# Patient Record
Sex: Female | Born: 1965 | Race: Black or African American | Hispanic: No | Marital: Married | Smoking: Current every day smoker
Health system: Southern US, Community
[De-identification: ages and names within clinical notes are randomized; demographics above are authoritative.]

## PROBLEM LIST (undated history)

## (undated) DIAGNOSIS — I10 Essential (primary) hypertension: Secondary | ICD-10-CM

## (undated) DIAGNOSIS — E119 Type 2 diabetes mellitus without complications: Secondary | ICD-10-CM

## (undated) HISTORY — PX: BREAST BIOPSY: SHX20

## (undated) HISTORY — DX: Essential (primary) hypertension: I10

## (undated) HISTORY — DX: Type 2 diabetes mellitus without complications: E11.9

---

## 2000-03-03 ENCOUNTER — Ambulatory Visit (HOSPITAL_BASED_OUTPATIENT_CLINIC_OR_DEPARTMENT_OTHER): Admission: RE | Admit: 2000-03-03 | Discharge: 2000-03-03 | Payer: Self-pay | Admitting: Ophthalmology

## 2000-04-15 ENCOUNTER — Ambulatory Visit (HOSPITAL_BASED_OUTPATIENT_CLINIC_OR_DEPARTMENT_OTHER): Admission: RE | Admit: 2000-04-15 | Discharge: 2000-04-15 | Payer: Self-pay | Admitting: Ophthalmology

## 2005-07-07 ENCOUNTER — Encounter: Admission: RE | Admit: 2005-07-07 | Discharge: 2005-07-07 | Payer: Self-pay | Admitting: Obstetrics and Gynecology

## 2006-08-17 ENCOUNTER — Encounter: Admission: RE | Admit: 2006-08-17 | Discharge: 2006-08-17 | Payer: Self-pay | Admitting: Obstetrics and Gynecology

## 2006-08-25 ENCOUNTER — Encounter: Admission: RE | Admit: 2006-08-25 | Discharge: 2006-08-25 | Payer: Self-pay | Admitting: Obstetrics and Gynecology

## 2007-02-27 ENCOUNTER — Encounter: Admission: RE | Admit: 2007-02-27 | Discharge: 2007-02-27 | Payer: Self-pay | Admitting: Obstetrics and Gynecology

## 2007-08-24 ENCOUNTER — Encounter: Admission: RE | Admit: 2007-08-24 | Discharge: 2007-08-24 | Payer: Self-pay | Admitting: Obstetrics and Gynecology

## 2007-12-20 IMAGING — [facility] MM DIAGNOSTIC LTD RIGHT
4 series · 4 of 4 positions shown · non-contrast
Comparison: none

[REDACTED] RIGHT
CC and MLO view(s) were taken of the right breast.

RIGHT BREAST ULTRASOUND
Technologist: Yulmar Malespin, Medical
DIGITAL LIMITED RIGHT DIAGNOSTIC MAMMOGRAM AND RIGHT BREAST ULTRASOUND:
CLINICAL DATA: 40-year-old returns after screening study for evaluation of the right breast.

[R CC (1 of 2)]
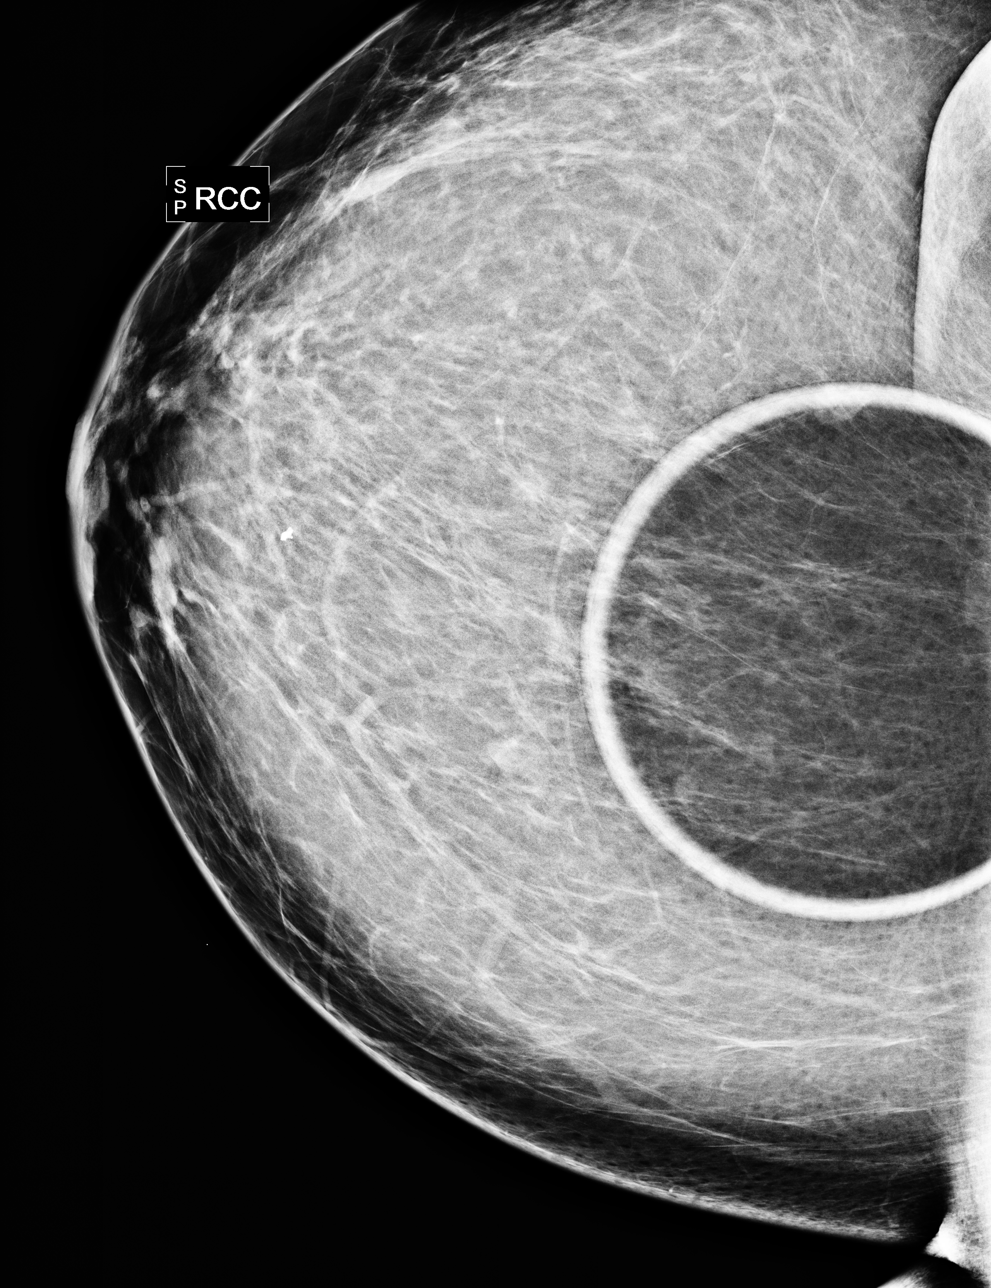

[R MLO (1 of 2)]
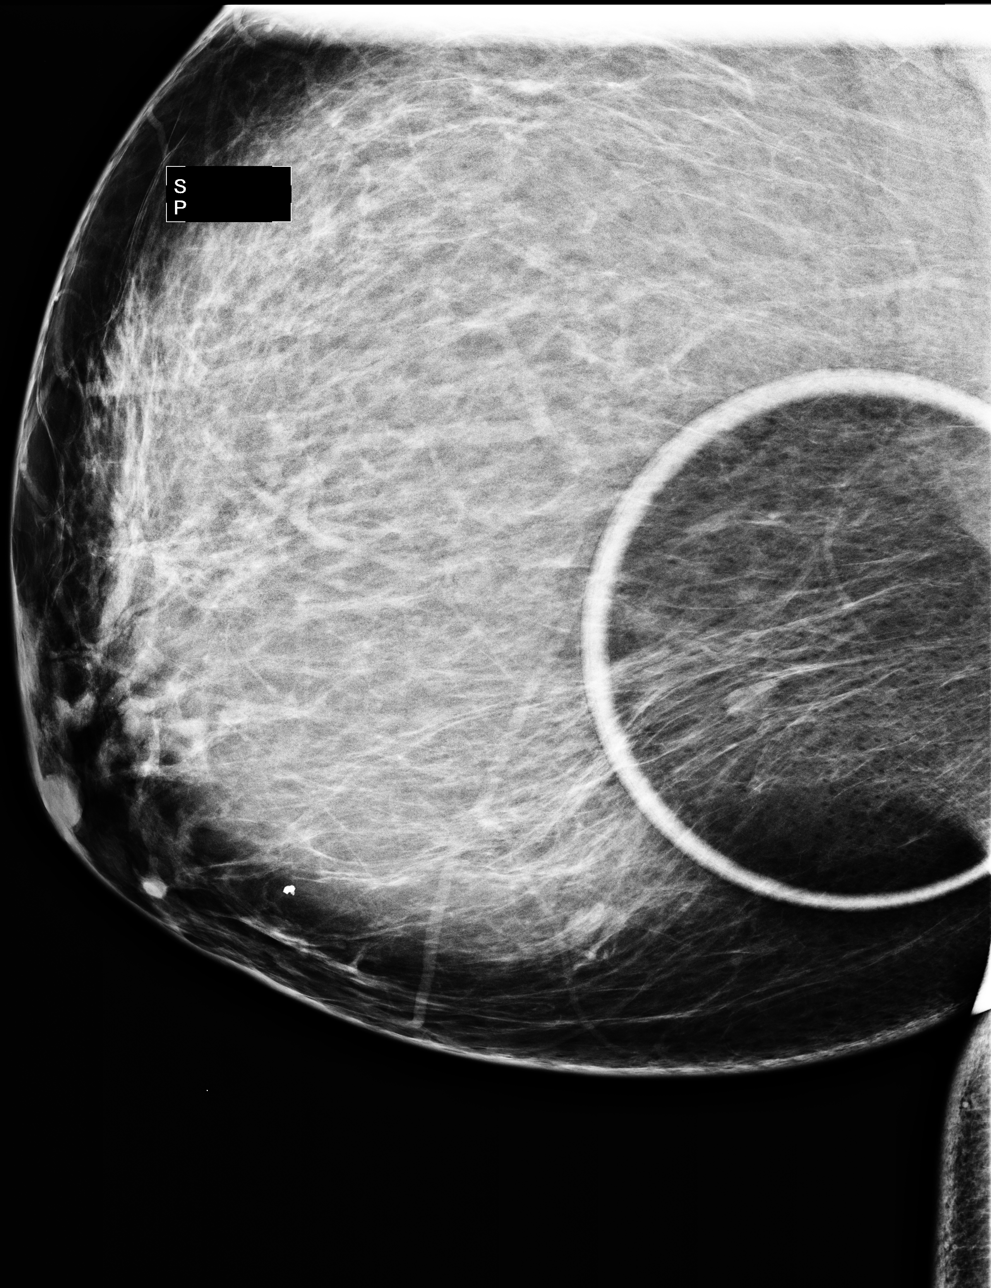

[R CC (2 of 2)]
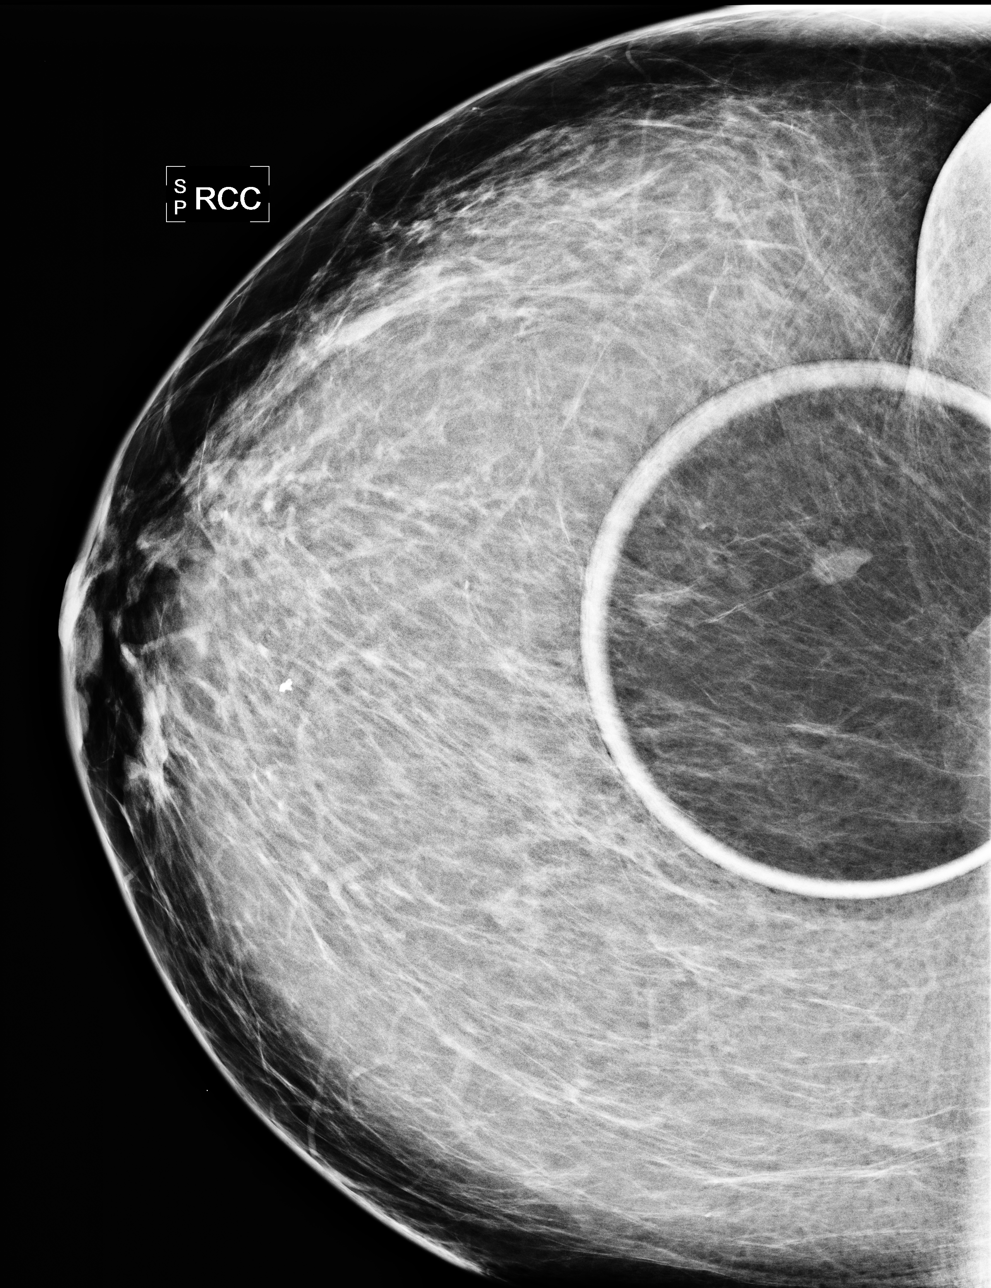

[R MLO (2 of 2)]
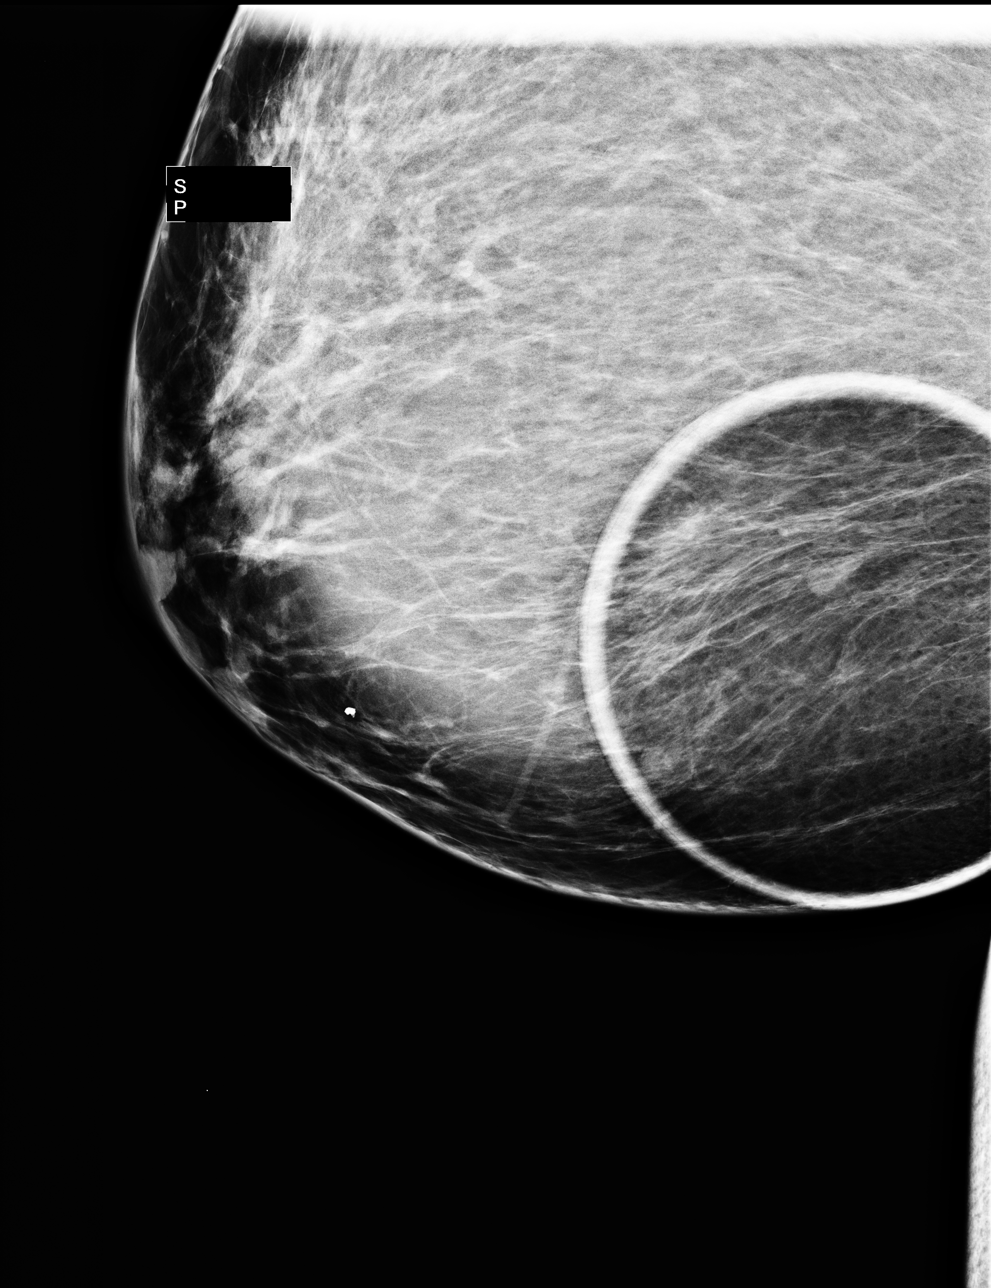

[4 of 4 positions shown; findings below may reference images not displayed]

Spot compression views are performed of the lower outer quadrant of the right breast, confirming 
the presence of a well-circumscribed subcentimeter nodule, deep within the breast.  On spot 
compression views the appearance favors a benign intramammary node based on the presence of fatty 
notch.

On physical exam, I do not palpate an abnormality in the lower outer quadrant of the right breast. 
Ultrasound is performed showing normal-appearing fibroglandular parenchyma.  No mass or acoustic 
shadowing is identified in the lower outer quadrant.
IMPRESSION: Persistent nodule in the right breast is likely a benign intramammary lymph node.  I would suggest 
a follow-up right mammogram in six months as this is not seen sonographically.

ASSESSMENT: Probably benign - BI-RADS 3

Diagnostic mammogram of the right breast in 6 months.
,

## 2008-08-27 ENCOUNTER — Encounter: Admission: RE | Admit: 2008-08-27 | Discharge: 2008-08-27 | Payer: Self-pay | Admitting: Obstetrics and Gynecology

## 2009-09-22 ENCOUNTER — Encounter: Admission: RE | Admit: 2009-09-22 | Discharge: 2009-09-22 | Payer: Self-pay | Admitting: Obstetrics and Gynecology

## 2009-12-12 ENCOUNTER — Emergency Department (HOSPITAL_COMMUNITY): Admission: EM | Admit: 2009-12-12 | Discharge: 2009-12-12 | Payer: Self-pay | Admitting: Family Medicine

## 2010-08-28 ENCOUNTER — Other Ambulatory Visit: Payer: Self-pay | Admitting: Obstetrics and Gynecology

## 2010-08-28 DIAGNOSIS — Z1231 Encounter for screening mammogram for malignant neoplasm of breast: Secondary | ICD-10-CM

## 2010-09-25 ENCOUNTER — Ambulatory Visit
Admission: RE | Admit: 2010-09-25 | Discharge: 2010-09-25 | Disposition: A | Discharge status: Home / Self Care | Payer: 59 | Source: Ambulatory Visit | Attending: Obstetrics and Gynecology | Admitting: Obstetrics and Gynecology

## 2010-09-25 DIAGNOSIS — Z1231 Encounter for screening mammogram for malignant neoplasm of breast: Secondary | ICD-10-CM

## 2011-03-22 ENCOUNTER — Other Ambulatory Visit: Payer: Self-pay | Admitting: Obstetrics & Gynecology

## 2011-03-31 ENCOUNTER — Other Ambulatory Visit: Payer: Self-pay | Admitting: Obstetrics & Gynecology

## 2011-03-31 ENCOUNTER — Other Ambulatory Visit: Payer: Self-pay | Admitting: Diagnostic Radiology

## 2011-03-31 ENCOUNTER — Ambulatory Visit
Admission: RE | Admit: 2011-03-31 | Discharge: 2011-03-31 | Disposition: A | Discharge status: Home / Self Care | Payer: 59 | Source: Ambulatory Visit | Attending: Obstetrics & Gynecology | Admitting: Obstetrics & Gynecology

## 2011-03-31 DIAGNOSIS — N6315 Unspecified lump in the right breast, overlapping quadrants: Secondary | ICD-10-CM

## 2011-04-02 ENCOUNTER — Ambulatory Visit
Admission: RE | Admit: 2011-04-02 | Discharge: 2011-04-02 | Disposition: A | Discharge status: Home / Self Care | Payer: 59 | Source: Ambulatory Visit | Attending: Obstetrics & Gynecology | Admitting: Obstetrics & Gynecology

## 2011-04-28 ENCOUNTER — Encounter: Payer: 59 | Admitting: Family Medicine

## 2011-09-21 ENCOUNTER — Other Ambulatory Visit: Payer: Self-pay | Admitting: Obstetrics & Gynecology

## 2011-09-21 DIAGNOSIS — Z1231 Encounter for screening mammogram for malignant neoplasm of breast: Secondary | ICD-10-CM

## 2011-10-05 ENCOUNTER — Ambulatory Visit
Admission: RE | Admit: 2011-10-05 | Discharge: 2011-10-05 | Disposition: A | Discharge status: Home / Self Care | Payer: 59 | Source: Ambulatory Visit | Attending: Obstetrics & Gynecology | Admitting: Obstetrics & Gynecology

## 2011-10-05 DIAGNOSIS — Z1231 Encounter for screening mammogram for malignant neoplasm of breast: Secondary | ICD-10-CM

## 2013-03-05 ENCOUNTER — Other Ambulatory Visit: Payer: Self-pay

## 2013-03-05 DIAGNOSIS — Z1231 Encounter for screening mammogram for malignant neoplasm of breast: Secondary | ICD-10-CM

## 2013-03-28 ENCOUNTER — Ambulatory Visit
Admission: RE | Admit: 2013-03-28 | Discharge: 2013-03-28 | Disposition: A | Discharge status: Home / Self Care | Payer: 59 | Source: Ambulatory Visit

## 2013-03-28 DIAGNOSIS — Z1231 Encounter for screening mammogram for malignant neoplasm of breast: Secondary | ICD-10-CM

## 2014-03-19 ENCOUNTER — Other Ambulatory Visit: Payer: Self-pay

## 2014-03-19 DIAGNOSIS — Z1231 Encounter for screening mammogram for malignant neoplasm of breast: Secondary | ICD-10-CM

## 2014-03-29 ENCOUNTER — Ambulatory Visit
Admission: RE | Admit: 2014-03-29 | Discharge: 2014-03-29 | Disposition: A | Discharge status: Home / Self Care | Payer: 59 | Source: Ambulatory Visit

## 2014-03-29 DIAGNOSIS — Z1231 Encounter for screening mammogram for malignant neoplasm of breast: Secondary | ICD-10-CM

## 2015-01-01 ENCOUNTER — Other Ambulatory Visit: Payer: Self-pay | Admitting: Internal Medicine

## 2015-01-01 ENCOUNTER — Ambulatory Visit
Admission: RE | Admit: 2015-01-01 | Discharge: 2015-01-01 | Disposition: A | Discharge status: Home / Self Care | Payer: 59 | Source: Ambulatory Visit | Attending: Internal Medicine | Admitting: Internal Medicine

## 2015-01-01 DIAGNOSIS — M79671 Pain in right foot: Secondary | ICD-10-CM

## 2015-03-20 ENCOUNTER — Other Ambulatory Visit: Payer: Self-pay

## 2015-03-20 DIAGNOSIS — Z1231 Encounter for screening mammogram for malignant neoplasm of breast: Secondary | ICD-10-CM

## 2015-04-11 ENCOUNTER — Ambulatory Visit
Admission: RE | Admit: 2015-04-11 | Discharge: 2015-04-11 | Disposition: A | Discharge status: Home / Self Care | Payer: 59 | Source: Ambulatory Visit

## 2015-04-11 DIAGNOSIS — Z1231 Encounter for screening mammogram for malignant neoplasm of breast: Secondary | ICD-10-CM

## 2015-11-14 ENCOUNTER — Other Ambulatory Visit: Payer: Self-pay | Admitting: Gastroenterology

## 2016-03-26 ENCOUNTER — Other Ambulatory Visit: Payer: Self-pay | Admitting: Obstetrics & Gynecology

## 2016-03-26 DIAGNOSIS — Z1231 Encounter for screening mammogram for malignant neoplasm of breast: Secondary | ICD-10-CM

## 2016-04-14 ENCOUNTER — Ambulatory Visit
Admission: RE | Admit: 2016-04-14 | Discharge: 2016-04-14 | Disposition: A | Discharge status: Home / Self Care | Payer: 59 | Source: Ambulatory Visit | Attending: Obstetrics & Gynecology | Admitting: Obstetrics & Gynecology

## 2016-04-14 DIAGNOSIS — Z1231 Encounter for screening mammogram for malignant neoplasm of breast: Secondary | ICD-10-CM

## 2016-07-09 ENCOUNTER — Emergency Department (HOSPITAL_COMMUNITY)
Admission: EM | Admit: 2016-07-09 | Discharge: 2016-07-09 | Disposition: A | Discharge status: Home / Self Care | Payer: 59 | Attending: Emergency Medicine | Admitting: Emergency Medicine

## 2016-07-09 ENCOUNTER — Encounter (HOSPITAL_COMMUNITY): Payer: Self-pay | Admitting: Emergency Medicine

## 2016-07-09 DIAGNOSIS — N3001 Acute cystitis with hematuria: Secondary | ICD-10-CM | POA: Diagnosis not present

## 2016-07-09 DIAGNOSIS — R300 Dysuria: Secondary | ICD-10-CM | POA: Diagnosis present

## 2016-07-09 LAB — URINALYSIS, ROUTINE W REFLEX MICROSCOPIC
BACTERIA UA: NONE SEEN
BILIRUBIN URINE: NEGATIVE
Glucose, UA: NEGATIVE mg/dL
KETONES UR: NEGATIVE mg/dL
Nitrite: NEGATIVE
PH: 6 (ref 5.0–8.0)
Protein, ur: 100 mg/dL — AB
SQUAMOUS EPITHELIAL / LPF: NONE SEEN
Specific Gravity, Urine: 1.012 (ref 1.005–1.030)

## 2016-07-09 LAB — POC URINE PREG, ED: Preg Test, Ur: NEGATIVE

## 2016-07-09 MED ORDER — IBUPROFEN 400 MG PO TABS
600.0000 mg | ORAL_TABLET | Freq: Once | ORAL | Status: AC
  Administered 2016-07-09: 600 mg via ORAL
  Filled 2016-07-09: qty 1

## 2016-07-09 MED ORDER — CEPHALEXIN 500 MG PO CAPS: 500.0000 mg | ORAL_CAPSULE | Freq: Two times a day (BID) | ORAL | 0 refills | Status: AC

## 2016-07-09 MED ORDER — CEPHALEXIN 250 MG PO CAPS
500.0000 mg | ORAL_CAPSULE | Freq: Once | ORAL | Status: AC
  Administered 2016-07-09: 500 mg via ORAL
  Filled 2016-07-09: qty 2

## 2016-07-09 NOTE — ED Triage Notes (Signed)
Pt in reporting painful urination and blood in urine. Denies hx kidney stones

## 2016-07-09 NOTE — ED Provider Notes (Signed)
[facility]EMERGENCY DEPT Provider Note   CSN: [id number] Arrival date & time: 07/09/16  7829     History   Chief Complaint Chief Complaint  Patient presents with  . Dysuria    HPI Cynthia Noble is a 51 y.o. female no sig PMH here with dysuria and hematuria starting last night.  She denies any history of prior kidney stones.  She states she had a UTI 20 years ago but does not know if it feels the same. She denies fever, N/V/D.  She has not been ill recently.  She denies and abdominal or back pain.  There are no further complaints.   10 Systems reviewed and are negative for acute change except as noted in the HPI.   HPI  History reviewed. No pertinent past medical history.  There are no active problems to display for this patient.   History reviewed. No pertinent surgical history.  OB History    No data available       Home Medications    Prior to Admission medications   Not on File    Family History No family history on file.  Social History Social History  Substance Use Topics  . Smoking status: Not on file  . Smokeless tobacco: Not on file  . Alcohol use Not on file     Allergies   Patient has no known allergies.   Review of Systems Review of Systems   Physical Exam Updated Vital Signs BP 135/73 (BP Location: Left Arm)   Pulse 93   Temp 98.2 F (36.8 C) (Oral)   Resp 18   Ht 5\' 3"  (1.6 m)   Wt 210 lb (95.3 kg)   SpO2 98%   BMI 37.20 kg/m   Physical Exam  Constitutional: She is oriented to person, place, and time. She appears well-developed and well-nourished. No distress.  HENT:  Head: Normocephalic and atraumatic.  Nose: Nose normal.  Mouth/Throat: Oropharynx is clear and moist. No oropharyngeal exudate.  Eyes: Conjunctivae and EOM are normal. Pupils are equal, round, and reactive to light. No scleral icterus.  Neck: Normal range of motion. Neck supple. No JVD present. No tracheal deviation present. No thyromegaly present.    Cardiovascular: Normal rate, regular rhythm and normal heart sounds.  Exam reveals no gallop and no friction rub.   No murmur heard. Pulmonary/Chest: Effort normal and breath sounds normal. No respiratory distress. She has no wheezes. She exhibits no tenderness.  Abdominal: Soft. Bowel sounds are normal. She exhibits no distension and no mass. There is no tenderness. There is no rebound and no guarding.  Musculoskeletal: Normal range of motion. She exhibits no edema or tenderness.  Lymphadenopathy:    She has no cervical adenopathy.  Neurological: She is alert and oriented to person, place, and time. No cranial nerve deficit. She exhibits normal muscle tone.  Skin: Skin is warm and dry. No rash noted. No erythema. No pallor.  Nursing note and vitals reviewed.    ED Treatments / Results  Labs (all labs ordered are listed, but only abnormal results are displayed) Labs Reviewed  URINE CULTURE  URINALYSIS, ROUTINE W REFLEX MICROSCOPIC  POC URINE PREG, ED    EKG  EKG Interpretation None       Radiology No results found.  Procedures Procedures (including critical care time)  Medications Ordered in ED Medications  ibuprofen (ADVIL,MOTRIN) tablet 600 mg (not administered)     Initial Impression / Assessment and Plan / ED Course  I have reviewed the  triage vital signs and the nursing notes.  Pertinent labs & imaging results that were available during my care of the patient were reviewed by me and considered in my medical decision making (see chart for details).      Patient presents to the ED for dysuria and hematuria.  UA reveals an infection.  Will treat with keflex, first dose given in the ED.  Plan to fu with PCP within 3 days for close follow up. She demonstrates good understanding of the plan. She appears well and in NAD.  Vs remain within her normal limits and she is safe for Dc.   Final Clinical Impressions(s) / ED Diagnoses   Final diagnoses:  None    New  Prescriptions New Prescriptions   No medications on file     Tomasita CrumbleAdeleke Andres Bantz, MD 07/09/16 [phone]

## 2016-07-11 LAB — URINE CULTURE

## 2016-07-12 ENCOUNTER — Telehealth (HOSPITAL_BASED_OUTPATIENT_CLINIC_OR_DEPARTMENT_OTHER): Payer: Self-pay | Admitting: Emergency Medicine

## 2016-07-12 NOTE — Telephone Encounter (Signed)
Post ED Visit - Positive Culture Follow-up  Culture report reviewed by antimicrobial stewardship pharmacist:  []  Enzo BiNathan Batchelder, Pharm.D. []  Celedonio MiyamotoJeremy Frens, Pharm.D., BCPS []  Garvin FilaMike Maccia, Pharm.D. []  Georgina PillionElizabeth Martin, Pharm.D., BCPS []  EaglevilleMinh Pham, VermontPharm.D., BCPS, AAHIVP []  Estella HuskMichelle Turner, Pharm.D., BCPS, AAHIVP []  Tennis Mustassie Stewart, Pharm.D. []  Rob Oswaldo DoneVincent, [address] Rainbow BoulevardPharm.D. Casilda Carlsaylor Stone PharmD  Positive urine culture Treated with cephalexin, organism sensitive to the same and no further patient follow-up is required at this time.  Berle MullMiller, Hebe Merriwether 07/12/2016, 11:06 AM

## 2016-10-04 ENCOUNTER — Other Ambulatory Visit (HOSPITAL_COMMUNITY): Payer: Self-pay | Admitting: Internal Medicine

## 2016-10-04 DIAGNOSIS — E0590 Thyrotoxicosis, unspecified without thyrotoxic crisis or storm: Secondary | ICD-10-CM

## 2016-10-14 ENCOUNTER — Encounter (HOSPITAL_COMMUNITY)
Admission: RE | Admit: 2016-10-14 | Discharge: 2016-10-14 | Disposition: A | Discharge status: Home / Self Care | Payer: 59 | Source: Ambulatory Visit | Attending: Internal Medicine | Admitting: Internal Medicine

## 2016-10-14 DIAGNOSIS — E0590 Thyrotoxicosis, unspecified without thyrotoxic crisis or storm: Secondary | ICD-10-CM | POA: Insufficient documentation

## 2016-10-14 MED ORDER — SODIUM IODIDE I 131 CAPSULE
11.8000 | Freq: Once | INTRAVENOUS | Status: AC | PRN
Start: 2016-10-14 — End: 2016-10-14
  Administered 2016-10-14: 11.8 via ORAL

## 2016-10-15 ENCOUNTER — Encounter (HOSPITAL_COMMUNITY)
Admission: RE | Admit: 2016-10-15 | Discharge: 2016-10-15 | Disposition: A | Discharge status: Home / Self Care | Payer: 59 | Source: Ambulatory Visit | Attending: Internal Medicine | Admitting: Internal Medicine

## 2016-10-15 DIAGNOSIS — E0590 Thyrotoxicosis, unspecified without thyrotoxic crisis or storm: Secondary | ICD-10-CM | POA: Diagnosis present

## 2016-10-20 ENCOUNTER — Other Ambulatory Visit: Payer: Self-pay | Admitting: Internal Medicine

## 2016-10-20 DIAGNOSIS — E0590 Thyrotoxicosis, unspecified without thyrotoxic crisis or storm: Secondary | ICD-10-CM

## 2016-10-20 DIAGNOSIS — E042 Nontoxic multinodular goiter: Secondary | ICD-10-CM

## 2016-10-20 DIAGNOSIS — E041 Nontoxic single thyroid nodule: Secondary | ICD-10-CM

## 2016-10-29 ENCOUNTER — Ambulatory Visit
Admission: RE | Admit: 2016-10-29 | Discharge: 2016-10-29 | Disposition: A | Discharge status: Home / Self Care | Payer: 59 | Source: Ambulatory Visit | Attending: Internal Medicine | Admitting: Internal Medicine

## 2016-10-29 DIAGNOSIS — E041 Nontoxic single thyroid nodule: Secondary | ICD-10-CM

## 2016-10-29 DIAGNOSIS — E042 Nontoxic multinodular goiter: Secondary | ICD-10-CM

## 2016-10-29 DIAGNOSIS — E0590 Thyrotoxicosis, unspecified without thyrotoxic crisis or storm: Secondary | ICD-10-CM

## 2016-11-02 ENCOUNTER — Other Ambulatory Visit: Payer: Self-pay | Admitting: Internal Medicine

## 2016-11-02 DIAGNOSIS — E041 Nontoxic single thyroid nodule: Secondary | ICD-10-CM

## 2016-11-23 ENCOUNTER — Ambulatory Visit
Admission: RE | Admit: 2016-11-23 | Discharge: 2016-11-23 | Disposition: A | Discharge status: Home / Self Care | Payer: 59 | Source: Ambulatory Visit | Attending: Internal Medicine | Admitting: Internal Medicine

## 2016-11-23 ENCOUNTER — Other Ambulatory Visit (HOSPITAL_COMMUNITY)
Admission: RE | Admit: 2016-11-23 | Discharge: 2016-11-23 | Disposition: A | Discharge status: Home / Self Care | Payer: 59 | Source: Ambulatory Visit | Attending: Student | Admitting: Student

## 2016-11-23 DIAGNOSIS — E041 Nontoxic single thyroid nodule: Secondary | ICD-10-CM | POA: Diagnosis not present

## 2017-03-29 ENCOUNTER — Other Ambulatory Visit: Payer: Self-pay | Admitting: Obstetrics & Gynecology

## 2017-03-29 DIAGNOSIS — Z1231 Encounter for screening mammogram for malignant neoplasm of breast: Secondary | ICD-10-CM

## 2017-04-18 ENCOUNTER — Ambulatory Visit
Admission: RE | Admit: 2017-04-18 | Discharge: 2017-04-18 | Disposition: A | Discharge status: Home / Self Care | Payer: 59 | Source: Ambulatory Visit | Attending: Obstetrics & Gynecology | Admitting: Obstetrics & Gynecology

## 2017-04-18 DIAGNOSIS — Z1231 Encounter for screening mammogram for malignant neoplasm of breast: Secondary | ICD-10-CM

## 2018-03-23 ENCOUNTER — Other Ambulatory Visit: Payer: Self-pay | Admitting: Obstetrics & Gynecology

## 2018-03-23 DIAGNOSIS — Z1231 Encounter for screening mammogram for malignant neoplasm of breast: Secondary | ICD-10-CM

## 2018-04-19 ENCOUNTER — Ambulatory Visit
Admission: RE | Admit: 2018-04-19 | Discharge: 2018-04-19 | Disposition: A | Discharge status: Home / Self Care | Payer: 59 | Source: Ambulatory Visit | Attending: Obstetrics & Gynecology | Admitting: Obstetrics & Gynecology

## 2018-04-19 ENCOUNTER — Ambulatory Visit: Payer: 59

## 2018-04-19 DIAGNOSIS — Z1231 Encounter for screening mammogram for malignant neoplasm of breast: Secondary | ICD-10-CM

## 2018-10-23 DIAGNOSIS — E1122 Type 2 diabetes mellitus with diabetic chronic kidney disease: Secondary | ICD-10-CM | POA: Diagnosis not present

## 2018-10-23 DIAGNOSIS — Z5181 Encounter for therapeutic drug level monitoring: Secondary | ICD-10-CM | POA: Diagnosis not present

## 2018-10-23 DIAGNOSIS — N181 Chronic kidney disease, stage 1: Secondary | ICD-10-CM | POA: Diagnosis not present

## 2018-10-23 DIAGNOSIS — Z720 Tobacco use: Secondary | ICD-10-CM | POA: Diagnosis not present

## 2019-04-11 DIAGNOSIS — Z01419 Encounter for gynecological examination (general) (routine) without abnormal findings: Secondary | ICD-10-CM | POA: Diagnosis not present

## 2019-04-11 DIAGNOSIS — Z6836 Body mass index (BMI) 36.0-36.9, adult: Secondary | ICD-10-CM | POA: Diagnosis not present

## 2019-04-11 DIAGNOSIS — Z1151 Encounter for screening for human papillomavirus (HPV): Secondary | ICD-10-CM | POA: Diagnosis not present

## 2019-04-11 DIAGNOSIS — Z1231 Encounter for screening mammogram for malignant neoplasm of breast: Secondary | ICD-10-CM | POA: Diagnosis not present

## 2019-08-31 ENCOUNTER — Other Ambulatory Visit: Payer: Self-pay | Admitting: Internal Medicine

## 2019-08-31 DIAGNOSIS — Z13220 Encounter for screening for lipoid disorders: Secondary | ICD-10-CM | POA: Diagnosis not present

## 2019-08-31 DIAGNOSIS — E042 Nontoxic multinodular goiter: Secondary | ICD-10-CM

## 2019-08-31 DIAGNOSIS — Z5181 Encounter for therapeutic drug level monitoring: Secondary | ICD-10-CM | POA: Diagnosis not present

## 2019-08-31 DIAGNOSIS — N181 Chronic kidney disease, stage 1: Secondary | ICD-10-CM | POA: Diagnosis not present

## 2019-08-31 DIAGNOSIS — E1122 Type 2 diabetes mellitus with diabetic chronic kidney disease: Secondary | ICD-10-CM | POA: Diagnosis not present

## 2019-08-31 DIAGNOSIS — Z720 Tobacco use: Secondary | ICD-10-CM | POA: Diagnosis not present

## 2019-09-04 ENCOUNTER — Ambulatory Visit
Admission: RE | Admit: 2019-09-04 | Discharge: 2019-09-04 | Disposition: A | Discharge status: Home / Self Care | Payer: BC Managed Care – PPO | Source: Ambulatory Visit | Attending: Internal Medicine | Admitting: Internal Medicine

## 2019-09-04 DIAGNOSIS — E042 Nontoxic multinodular goiter: Secondary | ICD-10-CM

## 2019-09-04 DIAGNOSIS — E041 Nontoxic single thyroid nodule: Secondary | ICD-10-CM | POA: Diagnosis not present

## 2020-03-03 DIAGNOSIS — Z720 Tobacco use: Secondary | ICD-10-CM | POA: Diagnosis not present

## 2020-03-03 DIAGNOSIS — N181 Chronic kidney disease, stage 1: Secondary | ICD-10-CM | POA: Diagnosis not present

## 2020-03-03 DIAGNOSIS — E1122 Type 2 diabetes mellitus with diabetic chronic kidney disease: Secondary | ICD-10-CM | POA: Diagnosis not present

## 2020-03-03 DIAGNOSIS — E669 Obesity, unspecified: Secondary | ICD-10-CM | POA: Diagnosis not present

## 2020-04-15 DIAGNOSIS — Z1231 Encounter for screening mammogram for malignant neoplasm of breast: Secondary | ICD-10-CM | POA: Diagnosis not present

## 2020-04-15 DIAGNOSIS — Z01419 Encounter for gynecological examination (general) (routine) without abnormal findings: Secondary | ICD-10-CM | POA: Diagnosis not present

## 2020-04-15 DIAGNOSIS — Z6836 Body mass index (BMI) 36.0-36.9, adult: Secondary | ICD-10-CM | POA: Diagnosis not present

## 2020-07-17 DIAGNOSIS — Z23 Encounter for immunization: Secondary | ICD-10-CM | POA: Diagnosis not present

## 2020-07-17 DIAGNOSIS — E1122 Type 2 diabetes mellitus with diabetic chronic kidney disease: Secondary | ICD-10-CM | POA: Diagnosis not present

## 2020-07-17 DIAGNOSIS — E669 Obesity, unspecified: Secondary | ICD-10-CM | POA: Diagnosis not present

## 2020-07-17 DIAGNOSIS — E042 Nontoxic multinodular goiter: Secondary | ICD-10-CM | POA: Diagnosis not present

## 2020-07-17 DIAGNOSIS — Z720 Tobacco use: Secondary | ICD-10-CM | POA: Diagnosis not present

## 2020-09-01 DIAGNOSIS — Z79899 Other long term (current) drug therapy: Secondary | ICD-10-CM | POA: Diagnosis not present

## 2020-09-01 DIAGNOSIS — Z7984 Long term (current) use of oral hypoglycemic drugs: Secondary | ICD-10-CM | POA: Diagnosis not present

## 2020-09-01 DIAGNOSIS — E669 Obesity, unspecified: Secondary | ICD-10-CM | POA: Diagnosis not present

## 2020-09-01 DIAGNOSIS — E1165 Type 2 diabetes mellitus with hyperglycemia: Secondary | ICD-10-CM | POA: Diagnosis not present

## 2020-09-01 DIAGNOSIS — E042 Nontoxic multinodular goiter: Secondary | ICD-10-CM | POA: Diagnosis not present

## 2020-09-01 DIAGNOSIS — E1122 Type 2 diabetes mellitus with diabetic chronic kidney disease: Secondary | ICD-10-CM | POA: Diagnosis not present

## 2020-09-01 DIAGNOSIS — N181 Chronic kidney disease, stage 1: Secondary | ICD-10-CM | POA: Diagnosis not present

## 2020-09-01 DIAGNOSIS — Z720 Tobacco use: Secondary | ICD-10-CM | POA: Diagnosis not present

## 2020-09-29 DIAGNOSIS — Z0000 Encounter for general adult medical examination without abnormal findings: Secondary | ICD-10-CM | POA: Diagnosis not present

## 2020-12-29 IMAGING — US US THYROID
1 series · 13 of 25 positions shown · non-contrast
Comparison: 10/29/2016, 11/23/2016

CLINICAL DATA: Thyroid nodules previous right thyroid FNA
11/23/2016

EXAM:
THYROID ULTRASOUND
TECHNIQUE: Ultrasound examination of the thyroid gland and adjacent soft
tissues was performed.

[Series 1: us thyroid · 0.06mm/px · 13 of 52 slices shown]
[im 1/52]
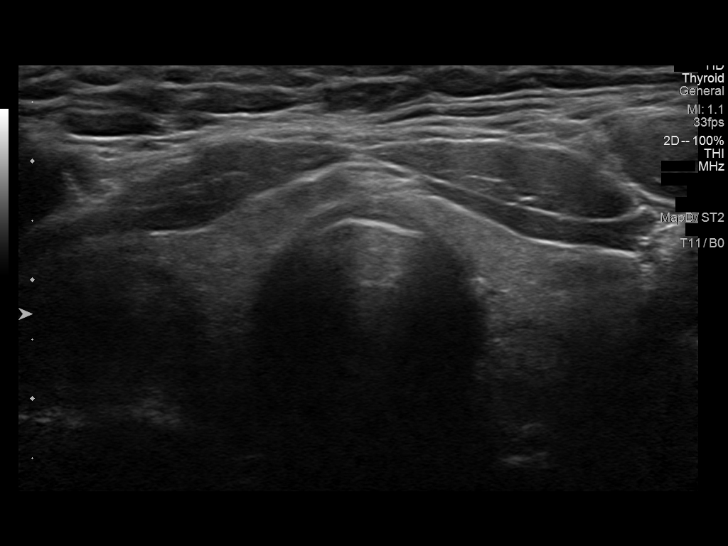
[im 5/52]
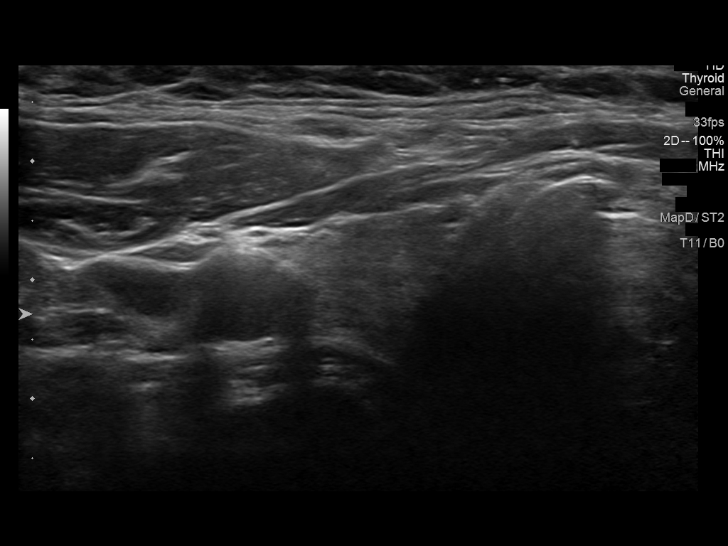
[im 9/52]
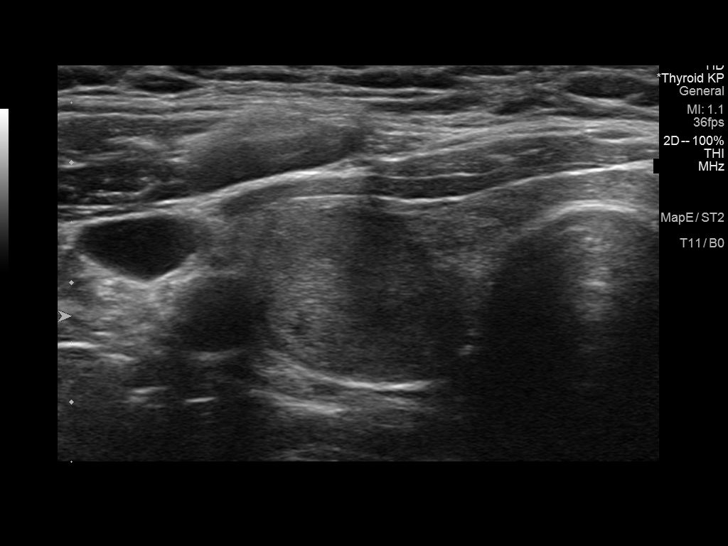
[im 13/52]
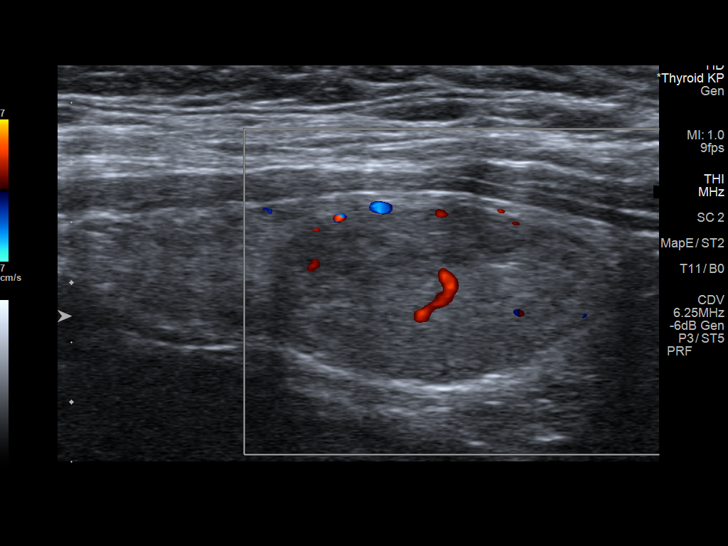
[im 18/52]
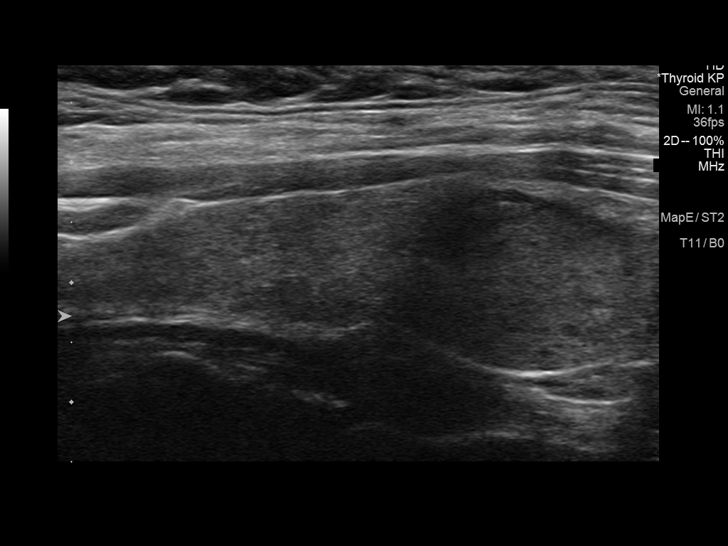
[im 22/52]
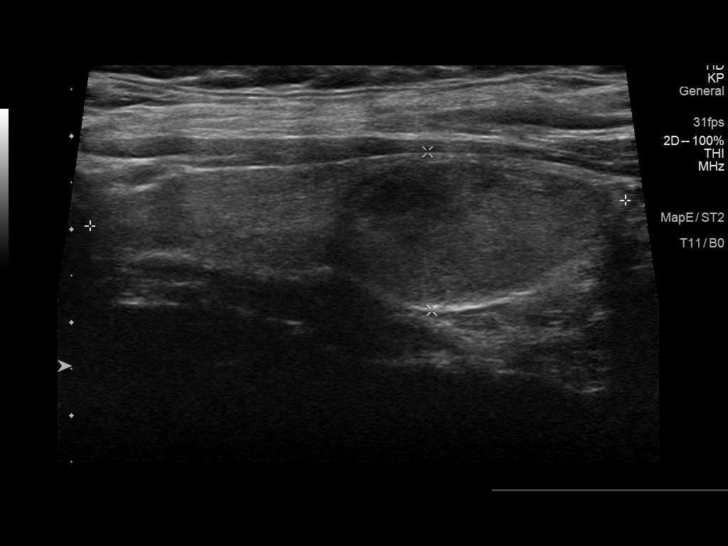
[im 26/52]
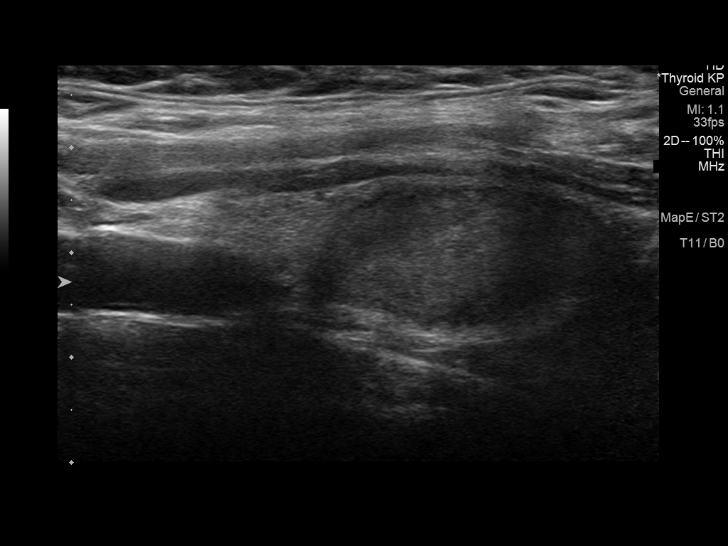
[im 30/52]
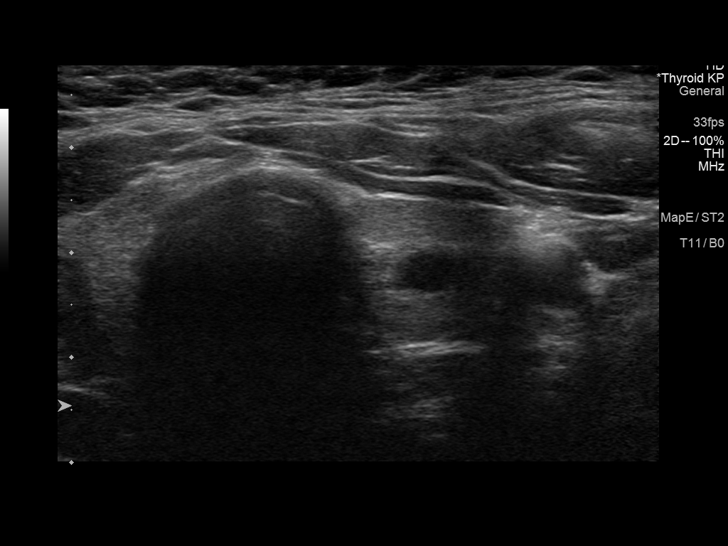
[im 35/52]
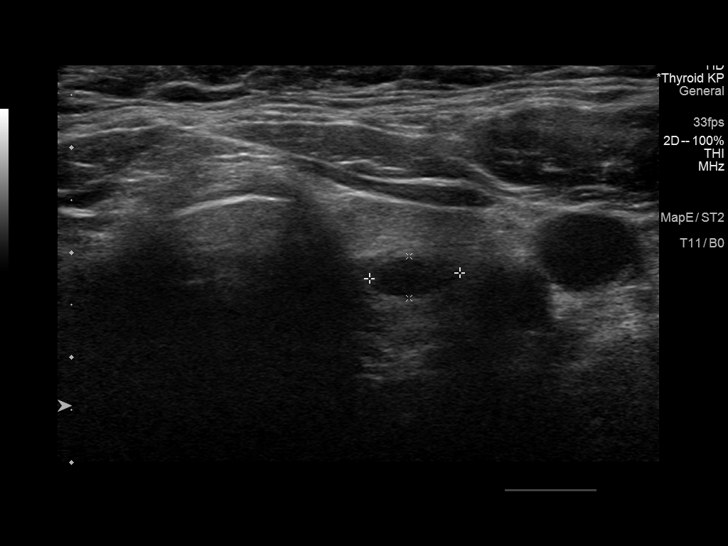
[im 39/52]
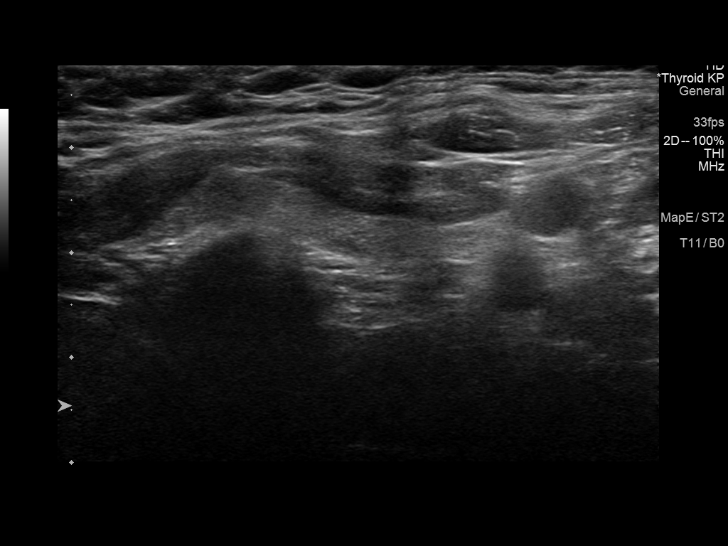
[im 43/52]
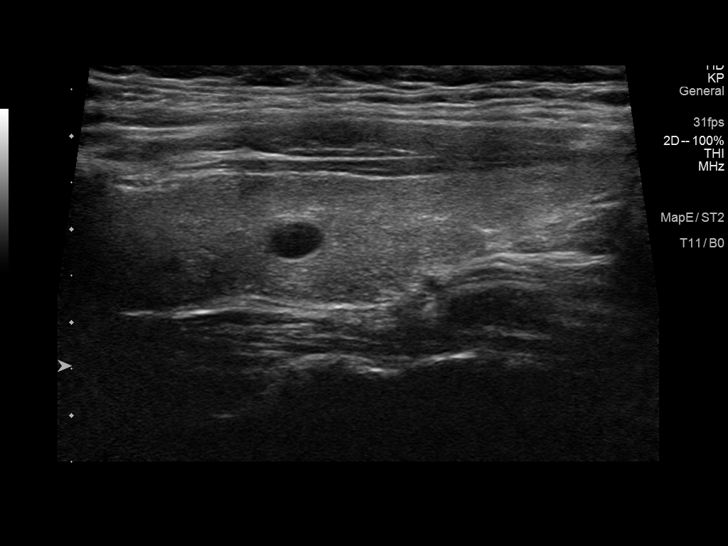
[im 47/52]
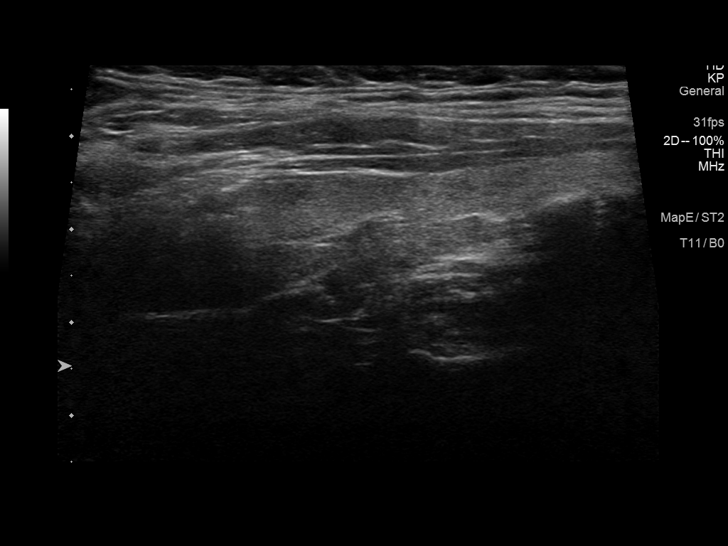
[im 52/52]
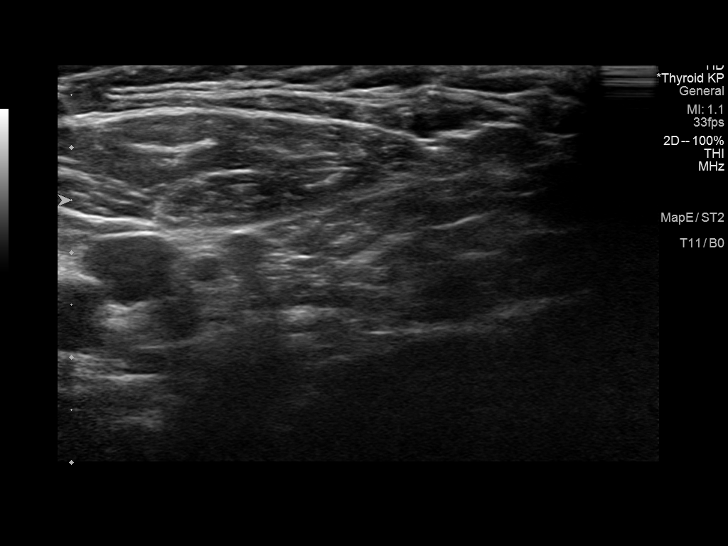

[13 of 25 positions shown; findings below may reference images not displayed]

FINDINGS: Parenchymal Echotexture: Mildly heterogenous

Isthmus: 5 mm

Right lobe: 5.8 x 1.7 x 2.3 cm

Left lobe: 5.9 x 1.4 x 1.7 cm

_________________________________________________________

Estimated total number of nodules >/= 1 cm: 1

Number of spongiform nodules >/=  2 cm not described below (TR1): 0

Number of mixed cystic and solid nodules >/= 1.5 cm not described
below (TR2): 0

_________________________________________________________

The previously biopsied right inferior thyroid TR 4 nodule measures
2.8 x 1.6 x 1.4 cm grossly unchanged. Correlate with prior
pathology.

Subcentimeter left mid thyroid hypoechoic cystic nodule measures
only 9 mm. This would not meet criteria for any biopsy or follow-up.
No other significant thyroid abnormality. No hypervascularity. No
regional adenopathy.
IMPRESSION: Stable 2.8 cm right inferior thyroid TR 4 nodule, previously
biopsied. Correlate with pathology.

No new thyroid finding.

The above is in keeping with the ACR TI-RADS recommendations - [HOSPITAL] 1384;[DATE].

## 2021-03-05 DIAGNOSIS — N181 Chronic kidney disease, stage 1: Secondary | ICD-10-CM | POA: Diagnosis not present

## 2021-03-05 DIAGNOSIS — Z720 Tobacco use: Secondary | ICD-10-CM | POA: Diagnosis not present

## 2021-03-05 DIAGNOSIS — E669 Obesity, unspecified: Secondary | ICD-10-CM | POA: Diagnosis not present

## 2021-03-05 DIAGNOSIS — E1122 Type 2 diabetes mellitus with diabetic chronic kidney disease: Secondary | ICD-10-CM | POA: Diagnosis not present

## 2021-03-30 DIAGNOSIS — I10 Essential (primary) hypertension: Secondary | ICD-10-CM | POA: Diagnosis not present

## 2021-03-30 DIAGNOSIS — E1122 Type 2 diabetes mellitus with diabetic chronic kidney disease: Secondary | ICD-10-CM | POA: Diagnosis not present

## 2021-03-30 DIAGNOSIS — N181 Chronic kidney disease, stage 1: Secondary | ICD-10-CM | POA: Diagnosis not present

## 2021-03-30 DIAGNOSIS — Z23 Encounter for immunization: Secondary | ICD-10-CM | POA: Diagnosis not present

## 2021-09-01 DIAGNOSIS — N181 Chronic kidney disease, stage 1: Secondary | ICD-10-CM | POA: Diagnosis not present

## 2021-09-01 DIAGNOSIS — E042 Nontoxic multinodular goiter: Secondary | ICD-10-CM | POA: Diagnosis not present

## 2021-09-01 DIAGNOSIS — E1122 Type 2 diabetes mellitus with diabetic chronic kidney disease: Secondary | ICD-10-CM | POA: Diagnosis not present

## 2021-09-01 DIAGNOSIS — Z720 Tobacco use: Secondary | ICD-10-CM | POA: Diagnosis not present

## 2021-09-01 DIAGNOSIS — E669 Obesity, unspecified: Secondary | ICD-10-CM | POA: Diagnosis not present

## 2021-10-01 DIAGNOSIS — Z0000 Encounter for general adult medical examination without abnormal findings: Secondary | ICD-10-CM | POA: Diagnosis not present

## 2021-10-01 DIAGNOSIS — Z23 Encounter for immunization: Secondary | ICD-10-CM | POA: Diagnosis not present

## 2021-11-09 DIAGNOSIS — H35033 Hypertensive retinopathy, bilateral: Secondary | ICD-10-CM | POA: Diagnosis not present

## 2022-02-02 DIAGNOSIS — Z86010 Personal history of colonic polyps: Secondary | ICD-10-CM | POA: Diagnosis not present

## 2022-03-30 DIAGNOSIS — I10 Essential (primary) hypertension: Secondary | ICD-10-CM | POA: Diagnosis not present

## 2022-03-30 DIAGNOSIS — Z720 Tobacco use: Secondary | ICD-10-CM | POA: Diagnosis not present

## 2022-03-30 DIAGNOSIS — N181 Chronic kidney disease, stage 1: Secondary | ICD-10-CM | POA: Diagnosis not present

## 2022-03-30 DIAGNOSIS — E042 Nontoxic multinodular goiter: Secondary | ICD-10-CM | POA: Diagnosis not present

## 2022-03-30 DIAGNOSIS — Z23 Encounter for immunization: Secondary | ICD-10-CM | POA: Diagnosis not present

## 2022-03-30 DIAGNOSIS — E1122 Type 2 diabetes mellitus with diabetic chronic kidney disease: Secondary | ICD-10-CM | POA: Diagnosis not present

## 2022-04-20 DIAGNOSIS — Z1231 Encounter for screening mammogram for malignant neoplasm of breast: Secondary | ICD-10-CM | POA: Diagnosis not present

## 2022-04-20 DIAGNOSIS — Z6835 Body mass index (BMI) 35.0-35.9, adult: Secondary | ICD-10-CM | POA: Diagnosis not present

## 2022-04-20 DIAGNOSIS — Z124 Encounter for screening for malignant neoplasm of cervix: Secondary | ICD-10-CM | POA: Diagnosis not present

## 2022-04-20 DIAGNOSIS — Z01419 Encounter for gynecological examination (general) (routine) without abnormal findings: Secondary | ICD-10-CM | POA: Diagnosis not present

## 2023-03-10 DIAGNOSIS — E669 Obesity, unspecified: Secondary | ICD-10-CM | POA: Diagnosis not present

## 2023-03-10 DIAGNOSIS — Z720 Tobacco use: Secondary | ICD-10-CM | POA: Diagnosis not present

## 2023-03-10 DIAGNOSIS — E1122 Type 2 diabetes mellitus with diabetic chronic kidney disease: Secondary | ICD-10-CM | POA: Diagnosis not present

## 2023-03-10 DIAGNOSIS — N181 Chronic kidney disease, stage 1: Secondary | ICD-10-CM | POA: Diagnosis not present

## 2023-03-10 DIAGNOSIS — R1031 Right lower quadrant pain: Secondary | ICD-10-CM | POA: Diagnosis not present

## 2023-03-10 DIAGNOSIS — E042 Nontoxic multinodular goiter: Secondary | ICD-10-CM | POA: Diagnosis not present

## 2023-03-31 DIAGNOSIS — H35033 Hypertensive retinopathy, bilateral: Secondary | ICD-10-CM | POA: Diagnosis not present

## 2023-03-31 DIAGNOSIS — H4603 Optic papillitis, bilateral: Secondary | ICD-10-CM | POA: Diagnosis not present

## 2023-04-06 DIAGNOSIS — N181 Chronic kidney disease, stage 1: Secondary | ICD-10-CM | POA: Diagnosis not present

## 2023-04-20 ENCOUNTER — Encounter: Payer: Self-pay | Admitting: Neurology

## 2023-04-20 ENCOUNTER — Ambulatory Visit: Payer: BC Managed Care – PPO | Admitting: Neurology

## 2023-04-20 VITALS — BP 126/81 | HR 80 | Ht 63.0 in | Wt 206.0 lb

## 2023-04-20 DIAGNOSIS — I10 Essential (primary) hypertension: Secondary | ICD-10-CM | POA: Diagnosis not present

## 2023-04-20 DIAGNOSIS — Z720 Tobacco use: Secondary | ICD-10-CM | POA: Diagnosis not present

## 2023-04-20 DIAGNOSIS — H4603 Optic papillitis, bilateral: Secondary | ICD-10-CM

## 2023-04-20 DIAGNOSIS — E785 Hyperlipidemia, unspecified: Secondary | ICD-10-CM

## 2023-04-20 DIAGNOSIS — Z6836 Body mass index (BMI) 36.0-36.9, adult: Secondary | ICD-10-CM

## 2023-04-20 NOTE — Progress Notes (Signed)
[facility]  PATIENT: Cynthia Noble DOB: 1966/03/29  REFERRING DOCTOR OR PCP: Dr. Bevelyn Ngo, OD. SOURCE: Patient, notes from optometry, visual field and funduscopy results reviewed  _________________________________   HISTORICAL  CHIEF COMPLAINT:  Chief Complaint  Patient presents with   New Patient (Initial Visit)    Pt in room 11, alone. New patient here for optic papillitis. Pt wears glasses, was told her optic nerve is swollen. Pt said her eyes water, no pain, no vision loss.     HISTORY OF PRESENT ILLNESS:  I had the pleasure of seeing your patient, Cynthia Noble, at [facility] for neurologic consultation regarding her bilateral papillitis.  She is a 57 year old woman who was noted to have bilateral papillitis.    She was having a routine eye exam and has not had visual symptoms or headache.  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  Corrected VA was 20/30 OD and 20/20 OS.     Colo vision is symmetric.   She notes no asymmetry.      She denies VF changes.       She denies issues with balance, gait, strength or sensation.    She has no neuro-imaging studies.  No known nutritional deficiencies.      She denies lightheadedness upon standing.   No vertigo, No tinnitus.     Vascular risks:  HTN (controlled), borderline DM, tobacco (1 ppw x 40 years), hyperlipidemia  No FH of visual issues.       REVIEW OF SYSTEMS: Constitutional: No fevers, chills, sweats, or change in appetite Eyes: No visual changes, double vision, eye pain Ear, nose and throat: No hearing loss, ear pain, nasal congestion, sore throat Cardiovascular: No chest pain, palpitations Respiratory:  No shortness of breath at rest or with exertion.   No wheezes GastrointestinaI: No nausea, vomiting, diarrhea, abdominal pain, fecal incontinence Genitourinary:  No  dysuria, urinary retention or frequency.  No nocturia. Musculoskeletal:  No neck pain, back pain Integumentary: No rash, pruritus, skin lesions Neurological: as above Psychiatric: No depression at this time.  No anxiety Endocrine: No palpitations, diaphoresis, change in appetite, change in weigh or increased thirst Hematologic/Lymphatic:  No anemia, purpura, petechiae. Allergic/Immunologic: No itchy/runny eyes, nasal congestion, recent allergic reactions, rashes  ALLERGIES: Allergies  Allergen Reactions   Jardiance [Empagliflozin] Itching   Septra [Sulfamethoxazole-Trimethoprim] Itching    Itching and hives.     HOME MEDICATIONS:  Current Outpatient Medications:    atorvastatin (LIPITOR) 10 MG tablet, Take 10 mg by mouth daily., Disp: , Rfl:    lisinopril (ZESTRIL) 5 MG tablet, Take 5 mg by mouth daily., Disp: , Rfl:    metFORMIN (GLUCOPHAGE-XR) 500 MG 24 hr tablet, Take 500 mg by mouth daily., Disp: , Rfl:    cephALEXin (KEFLEX) 500 MG capsule, Take 1 capsule (500 mg total) by mouth 2 (two) times daily. (Patient not taking: Reported on 04/20/2023), Disp: 6 capsule, Rfl: 0  PAST MEDICAL HISTORY: Past Medical History:  Diagnosis Date   Diabetes (HCC)    Hypertension     PAST SURGICAL HISTORY: Past Surgical History:  Procedure Laterality Date   BREAST BIOPSY Right    benign    FAMILY HISTORY: Family History  Problem Relation Age of Onset   High blood pressure Mother    Cervical cancer Sister     SOCIAL HISTORY: Social History   Socioeconomic History  Marital status: Married    Spouse name: Not on file   Number of children: 0   Years of education: Not on file   Highest education level: Associate degree: academic program  Occupational History   Not on file  Tobacco Use   Smoking status: Every Day    Current packs/day: 0.25    Types: Cigarettes   Smokeless tobacco: Never  Vaping Use   Vaping status: Never Used  Substance and Sexual Activity   Alcohol use:  Yes    Comment: social once per month   Drug use: Never   Sexual activity: Not on file  Other Topics Concern   Not on file  Social History Narrative   Left handed    Wear glasses    Drinks coffee (rare )   1 can of soda per day.   Social Determinants of Health   Financial Resource Strain: Not on file  Food Insecurity: Not on file  Transportation Needs: Not on file  Physical Activity: Not on file  Stress: Not on file  Social Connections: Not on file  Intimate Partner Violence: Not on file       PHYSICAL EXAM  Vitals:   04/20/23 0831 04/20/23 0845  BP: (!) 142/83 126/81  Pulse: 84 80  Weight: 206 lb (93.4 kg)   Height: 5\' 3"  (1.6 m)     Body mass index is 36.49 kg/m.   General: The patient is well-developed and well-nourished and in no acute distress  HEENT:  Head is [place]/AT.  Sclera are anicteric.  Funduscopic exam shows bilateral disc edema.  I could not appreciate venous pulsations.  Neck: No carotid bruits are noted.  The neck is nontender.  Cardiovascular: The heart has a regular rate and rhythm with a normal S1 and S2. There were no murmurs, gallops or rubs.    Skin: Extremities are without rash or  edema.  Musculoskeletal:  Back is nontender  Neurologic Exam  Mental status: The patient is alert and oriented x 3 at the time of the examination. The patient has apparent normal recent and remote memory, with an apparently normal attention span and concentration ability.   Speech is normal.  Cranial nerves: Extraocular movements are full. Pupils are equal, round, and reactive to light and accomodation.  Visual fields are full.  Facial symmetry is present. There is good facial sensation to soft touch bilaterally.Facial strength is normal.  Trapezius and sternocleidomastoid strength is normal. No dysarthria is noted.  The tongue is midline, and the patient has symmetric elevation of the soft palate. No obvious hearing deficits are noted.  Motor:  Muscle bulk is  normal.   Tone is normal. Strength is  5 / 5 in all 4 extremities.   Sensory: Sensory testing is intact to pinprick, soft touch and vibration sensation in all 4 extremities.  Coordination: Cerebellar testing reveals good finger-nose-finger and heel-to-shin bilaterally.  Gait and station: Station is normal.   Gait is normal. Tandem gait is normal. Romberg is negative.   Reflexes: Deep tendon reflexes are symmetric and normal bilaterally.   Plantar responses are flexor.      ASSESSMENT AND PLAN  Optic papillitis of both eyes - Plan: Sedimentation rate, C-reactive protein, Vitamin B12, TSH, Angiotensin converting enzyme, MR BRAIN W WO CONTRAST  Hypertension, essential  Current tobacco use  Hyperlipidemia, unspecified hyperlipidemia type  BMI 36.0-36.9,adult   In summary, Cynthia Noble is a 57 year old woman who was noted to have bilateral papillitis on routine ophthalmologic evaluation.  She had no other symptoms such as visual disturbance or headache.  Visual field testing was essentially normal.  Because of the bilateral nature of the papillitis, idiopathic intracranial hypertension is possible.  We will check MRI of the brain to evaluate for changes associated with IIH as well as to rule out other neuropathologic processes that could cause bilateral papilledema.  Additionally we will check angiotensin-converting enzyme, ESR, CRP to assess for sarcoid and order arteritic etiology.   Based on the results of the MRI, we will likely need to have her do a lumbar puncture to assess CSF pressure and consider acetazolamide or other agent if this is occurring.  She will return to see me in 4 months for follow-up or sooner if there are new or worsening neurologic symptoms.  Thank you for asking me to see Cynthia Noble.  Please let me know if I can be of further assistance with her or other patients in the future.      Cynthia Baine A. Epimenio Foot, MD, [facility] 04/20/2023, 9:43 AM Certified in  Neurology, Clinical Neurophysiology, Sleep Medicine and Neuroimaging  [facility] [address], [address] [place], [place] [address] [phone]

## 2023-04-21 LAB — SEDIMENTATION RATE: Sed Rate: 44 mm/h — ABNORMAL HIGH (ref 0–40)

## 2023-04-21 LAB — TSH: TSH: 0.616 u[IU]/mL (ref 0.450–4.500)

## 2023-04-21 LAB — VITAMIN B12: Vitamin B-12: 468 pg/mL (ref 232–1245)

## 2023-04-21 LAB — ANGIOTENSIN CONVERTING ENZYME: Angio Convert Enzyme: 7 U/L — ABNORMAL LOW (ref 14–82)

## 2023-04-21 LAB — C-REACTIVE PROTEIN: CRP: 4 mg/L (ref 0–10)

## 2023-04-25 ENCOUNTER — Telehealth: Payer: Self-pay | Admitting: Neurology

## 2023-04-25 NOTE — Telephone Encounter (Signed)
Pt scheduled for 45 mins MR brain w/wo contrast at [facility] for 05/04/23 at 8:45am  [employer] auth# [id number] (04/21/23-05/20/23)

## 2023-05-04 ENCOUNTER — Ambulatory Visit (INDEPENDENT_AMBULATORY_CARE_PROVIDER_SITE_OTHER): Payer: BC Managed Care – PPO

## 2023-05-04 DIAGNOSIS — H4603 Optic papillitis, bilateral: Secondary | ICD-10-CM

## 2023-05-04 MED ORDER — GADOBENATE DIMEGLUMINE 529 MG/ML IV SOLN
20.0000 mL | Freq: Once | INTRAVENOUS | Status: AC | PRN
  Administered 2023-05-04: 20 mL via INTRAVENOUS

## 2023-05-05 ENCOUNTER — Telehealth: Payer: Self-pay | Admitting: *Deleted

## 2023-05-05 DIAGNOSIS — H4710 Unspecified papilledema: Secondary | ICD-10-CM

## 2023-05-05 NOTE — Telephone Encounter (Signed)
-----   Message from Asa Lente sent at 05/04/2023  5:30 PM EST ----- Please let her know that the MRI of the brain showed some mild changes due to age, diabetes and hypertension but there was nothing significantly wrong.  Therefore, I would like to proceed with a lumbar puncture for "papilledema" to assess for idiopathic intracranial hypertension.  She can be sent to [facility] imaging or other site

## 2023-05-05 NOTE — Telephone Encounter (Signed)
Left message for patient to call.

## 2023-05-09 NOTE — Telephone Encounter (Signed)
Patient informed, order placed at [facility] imaging they will call to schedule.

## 2023-05-10 ENCOUNTER — Other Ambulatory Visit: Payer: Self-pay | Admitting: *Deleted

## 2023-05-10 DIAGNOSIS — H4710 Unspecified papilledema: Secondary | ICD-10-CM

## 2023-05-10 DIAGNOSIS — I10 Essential (primary) hypertension: Secondary | ICD-10-CM

## 2023-05-10 DIAGNOSIS — H4603 Optic papillitis, bilateral: Secondary | ICD-10-CM

## 2023-05-17 NOTE — Discharge Instructions (Signed)

## 2023-05-18 ENCOUNTER — Inpatient Hospital Stay: Admission: RE | Admit: 2023-05-18 | Payer: BC Managed Care – PPO | Source: Ambulatory Visit

## 2023-05-18 ENCOUNTER — Other Ambulatory Visit: Payer: Self-pay | Admitting: Neurology

## 2023-05-18 ENCOUNTER — Ambulatory Visit
Admission: RE | Admit: 2023-05-18 | Discharge: 2023-05-18 | Disposition: A | Discharge status: Home / Self Care | Payer: BC Managed Care – PPO | Source: Ambulatory Visit | Attending: Neurology | Admitting: Neurology

## 2023-05-18 DIAGNOSIS — I10 Essential (primary) hypertension: Secondary | ICD-10-CM

## 2023-05-18 DIAGNOSIS — H4710 Unspecified papilledema: Secondary | ICD-10-CM | POA: Diagnosis not present

## 2023-05-18 DIAGNOSIS — H4603 Optic papillitis, bilateral: Secondary | ICD-10-CM

## 2023-05-18 MED ORDER — ACETAZOLAMIDE ER 500 MG PO CP12: 500.0000 mg | ORAL_CAPSULE | Freq: Two times a day (BID) | ORAL | 5 refills | Status: DC

## 2023-05-23 ENCOUNTER — Other Ambulatory Visit: Payer: Self-pay | Admitting: Neurology

## 2023-05-23 ENCOUNTER — Telehealth: Payer: Self-pay | Admitting: Neurology

## 2023-05-23 DIAGNOSIS — G971 Other reaction to spinal and lumbar puncture: Secondary | ICD-10-CM

## 2023-05-23 LAB — GLUCOSE, CSF: Glucose, CSF: 121 mg/dL — ABNORMAL HIGH (ref 40–80)

## 2023-05-23 LAB — CSF CELL COUNT WITH DIFFERENTIAL
RBC Count, CSF: 0 {cells}/uL
TOTAL NUCLEATED CELL: 0 {cells}/uL (ref 0–5)

## 2023-05-23 LAB — ANGIOTENSIN CONVERTING ENZYME, CSF: ANGIOTENSIN CONVERTING ENZYME ( ACE) CSF: 7 U/L (ref <=15)

## 2023-05-23 LAB — PROTEIN, CSF: Total Protein, CSF: 16 mg/dL (ref 15–45)

## 2023-05-23 NOTE — Telephone Encounter (Signed)
Call to patient about blood patch, gave IR phone number of [phone] to call and schedule, patient states she will call if she has any further issues.

## 2023-05-23 NOTE — Telephone Encounter (Signed)
Call to patient who reports till having pretty constant headache since spinal tap. Reports when she lays down and relaxes and does dull but never goes away completely but when she gets up and starts moving around again, it will worsen again. Tylenol will help but doesn't eliminate it completely. She is still taking Diamox 500mg  BID as prescribed but looking for more recommendations to get the pain under control. I advised I would forward to the provider for more advise.

## 2023-05-23 NOTE — Telephone Encounter (Signed)
Pt states while on acetaZOLAMIDE ER (DIAMOX) 500 MG capsule she has headaches, she feels better when she lays down , but when she gets up and moves about the headaches return.  Pt is asking if this is a reaction to medication or if something else is going on , please call.

## 2023-05-26 ENCOUNTER — Ambulatory Visit
Admission: RE | Admit: 2023-05-26 | Discharge: 2023-05-26 | Disposition: A | Discharge status: Home / Self Care | Payer: BC Managed Care – PPO | Source: Ambulatory Visit | Attending: Neurology | Admitting: Neurology

## 2023-05-26 DIAGNOSIS — G971 Other reaction to spinal and lumbar puncture: Secondary | ICD-10-CM

## 2023-05-26 NOTE — Discharge Instructions (Signed)
Blood Patch Discharge Instructions  Go home and rest quietly for the next 24 hours.  It is important to lie flat for the next 24 hours.  Get up only to go to the restroom.  You may lie in the bed or on a couch on your back, your stomach, your left side or your right side.  You may have one pillow under your head.  You may have pillows between your knees while you are on your side or under your knees while you are on your back.  DO NOT drive today.  Recline the seat as far back as it will go, while still wearing your seat belt, on the way home.  You may get up to go to the bathroom as needed.  You may sit up for 10 minutes to eat.  You may resume your normal diet and medications unless otherwise indicated.  Drink lots of extra fluids today and tomorrow..   You may resume normal activities after your 24 hours of bed rest is over; however, do not exert yourself strongly or do any heavy lifting tomorrow.  Call your physician for a follow-up appointment.   If you have any questions  after you arrive home, please call [phone].  Discharge instructions have been explained to the patient.  The patient, or the person responsible for the patient, fully understands these instructions.    

## 2023-05-27 ENCOUNTER — Telehealth: Payer: Self-pay | Admitting: Neurology

## 2023-05-27 NOTE — Telephone Encounter (Signed)
Pt asking when to start taking the acetaZOLAMIDE ER (DIAMOX) 500 MG capsule. When having the headaches Dr. Epimenio Foot advise to discontinue taking the medication. Would like a call back.

## 2023-05-30 NOTE — Telephone Encounter (Signed)
Took call from phone room/Annie and spoke with Cynthia Noble. She is no longer having headaches. Message received Friday incorrect.   Told to stop Diamox d/t having blood patch this past Thursday. Decided not to proceed with blood patch since she was feeling better. Has had no headache in the last 5-6 days. She will start back on Diamox ER 500mg  po BID. If she develops headache again, she will let us know.  She did not have 4 month f/u scheduled (last seen 04/20/23). Scheduled for 08/15/23 at 2:30pm with Dr. Epimenio Foot.

## 2023-05-30 NOTE — Telephone Encounter (Signed)
LVM for pt to call back.

## 2023-08-15 ENCOUNTER — Ambulatory Visit: Payer: Commercial Managed Care - HMO | Admitting: Neurology

## 2023-08-15 ENCOUNTER — Encounter: Payer: Self-pay | Admitting: Neurology

## 2023-08-15 VITALS — BP 130/75 | HR 88 | Ht 63.0 in | Wt 202.0 lb

## 2023-08-15 DIAGNOSIS — Z6836 Body mass index (BMI) 36.0-36.9, adult: Secondary | ICD-10-CM

## 2023-08-15 DIAGNOSIS — H4710 Unspecified papilledema: Secondary | ICD-10-CM

## 2023-08-15 DIAGNOSIS — G932 Benign intracranial hypertension: Secondary | ICD-10-CM | POA: Diagnosis not present

## 2023-08-15 MED ORDER — ACETAZOLAMIDE ER 500 MG PO CP12: 500.0000 mg | ORAL_CAPSULE | Freq: Two times a day (BID) | ORAL | 11 refills | Status: DC

## 2023-08-15 NOTE — Progress Notes (Signed)
 [facility]  PATIENT: Cynthia Noble DOB: January 03, 1966  REFERRING DOCTOR OR PCP: Dr. Bevelyn Ngo, OD. SOURCE: Patient, notes from optometry, visual field and funduscopy results reviewed  _________________________________   HISTORICAL  CHIEF COMPLAINT:  Chief Complaint  Patient presents with   Follow-up    Pt in 10 Pt wants to discuss diamox Pt states some nausea  with medication Pt states increased urination     HISTORY OF PRESENT ILLNESS:  Cynthia Noble is a 58 y.o. 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  Update 08/15/2023: Since her last visit in November, she has had an MRI of the brain that was noncontributory.  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  She was started on acetazolamide 500 mg twice daily.  She takes bid most days.  Initially she felt nauseous but not now.  She misses a couple doses a week.    She denies issues with balance, gait, strength or sensation.    She has no neuro-imaging studies.  No known nutritional deficiencies.      She denies lightheadedness upon standing.   No vertigo, No tinnitus.    Vascular risks:  HTN (controlled), borderline DM, tobacco (1 ppw x 40 years), hyperlipidemia+  She is overweight but has lost a few pounds from 206 to 202 pounds  She is trying to eat better.    IIH/papilledema She was noted to have bilateral papillitis on a routine eye exam in 2024.  She had not noted visual symptoms or headache.  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  Corrected VA was 20/30 OD and 20/20 OS.     Colo vision is symmetric.   She notes no asymmetry.      She denies VF changes.     MRI of the brain in November 2024 was unremarkable.  Lumbar puncture showed elevated intracranial pressure of  35 cm    In the past she had  a PSG nd snored but did not have OSA by her report.     No FH of visual issues.       REVIEW OF SYSTEMS: Constitutional: No fevers, chills, sweats, or change in appetite Eyes: No visual changes, double vision, eye pain Ear, nose and throat: No hearing loss, ear pain, nasal congestion, sore throat Cardiovascular: No chest pain, palpitations Respiratory:  No shortness of breath at rest or with exertion.   No wheezes GastrointestinaI: No nausea, vomiting, diarrhea, abdominal pain, fecal incontinence Genitourinary:  No dysuria, urinary retention or frequency.  No nocturia. Musculoskeletal:  No neck pain, back pain Integumentary: No rash, pruritus, skin lesions Neurological: as above Psychiatric: No depression at this time.  No anxiety Endocrine: No palpitations, diaphoresis, change in appetite, change in weigh or increased thirst Hematologic/Lymphatic:  No anemia, purpura, petechiae. Allergic/Immunologic: No itchy/runny eyes, nasal congestion, recent allergic reactions, rashes  ALLERGIES: Allergies  Allergen Reactions   Jardiance [Empagliflozin] Itching   Septra [Sulfamethoxazole-Trimethoprim] Itching    Itching and hives.     HOME MEDICATIONS:  Current Outpatient Medications:    atorvastatin (LIPITOR) 10 MG tablet, Take 10 mg by mouth daily., Disp: , Rfl:    cephALEXin (KEFLEX) 500 MG capsule, Take 1 capsule (500 mg total) by mouth 2 (two) times daily., Disp: 6 capsule, Rfl: 0   lisinopril (ZESTRIL) 5 MG  tablet, Take 5 mg by mouth daily., Disp: , Rfl:    metFORMIN (GLUCOPHAGE-XR) 500 MG 24 hr tablet, Take 500 mg by mouth daily., Disp: , Rfl:    acetaZOLAMIDE ER (DIAMOX) 500 MG capsule, Take 1 capsule (500 mg total) by mouth 2 (two) times daily., Disp: 60 capsule, Rfl: 11  PAST MEDICAL HISTORY: Past Medical History:  Diagnosis Date   Diabetes (HCC)    Hypertension     PAST SURGICAL HISTORY: Past Surgical History:  Procedure Laterality Date    BREAST BIOPSY Right    benign    FAMILY HISTORY: Family History  Problem Relation Age of Onset   High blood pressure Mother    Cervical cancer Sister     SOCIAL HISTORY: Social History   Socioeconomic History   Marital status: Married    Spouse name: Not on file   Number of children: 0   Years of education: Not on file   Highest education level: Associate degree: academic program  Occupational History   Not on file  Tobacco Use   Smoking status: Every Day    Current packs/day: 0.25    Types: Cigarettes   Smokeless tobacco: Never  Vaping Use   Vaping status: Never Used  Substance and Sexual Activity   Alcohol use: Yes    Comment: social once per month   Drug use: Never   Sexual activity: Not on file  Other Topics Concern   Not on file  Social History Narrative   Left handed    Wear glasses    Drinks coffee (rare )   1 can of soda per day.   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   General: The patient is well-developed and well-nourished and in no acute distress  HEENT:  Head is [place]/AT.  Sclera are anicteric.  Funduscopic exam shows only mild optic nerve edema now and VP are present. --- improved appearance   Skin: Extremities are without rash or  edema.  Musculoskeletal:  Back is nontender  Neurologic Exam  Mental status: The patient is alert and oriented x 3 at the time of the examination. The patient has apparent normal recent and remote memory, with an apparently normal attention span and concentration ability.   Speech is  normal.  Cranial nerves: Extraocular movements are full. Pupils are equal, round, and reactive to light and accomodation.  Visual fields are full.  Facial symmetry is present. There is good facial sensation to soft touch bilaterally.Facial strength is normal.  Trapezius and sternocleidomastoid strength is normal. No dysarthria is noted.  The tongue is midline, and the patient has symmetric elevation of the soft palate. No obvious hearing deficits are noted.  Motor:  Muscle bulk is normal.   Tone is normal. Strength is  5 / 5 in all 4 extremities.   Sensory: Sensory testing is intact to pinprick, soft touch and vibration sensation in all 4 extremities.  Coordination: Cerebellar testing reveals  good finger-nose-finger and heel-to-shin bilaterally.  Gait and station: Station is normal.   Gait is normal. Tandem gait is normal. Romberg is negative.   Reflexes: Deep tendon reflexes are symmetric and normal bilaterally.   Plantar responses are flexor.      ASSESSMENT AND PLAN  IIH (idiopathic intracranial hypertension)  Papilledema  BMI 36.0-36.9,adult    Continue acetazolamide.  We discussed that if weight goes down to about 170 or less we could consider d/c and following her eye exams.    Current exam is better.  She will f/u with ophtho in a few months as well Stay active and exercise as tolerated. She will return to see me in 1 year.      Devanta Daniel A. Epimenio Foot, MD, [facility] 08/15/2023, 5:06 PM Certified in Neurology, Clinical Neurophysiology, Sleep Medicine and Neuroimaging  [facility] [address], [address] [place], [place] [address] [phone]

## 2023-10-28 LAB — LAB REPORT - SCANNED: EGFR: 45

## 2024-01-03 ENCOUNTER — Other Ambulatory Visit: Payer: Self-pay | Admitting: Internal Medicine

## 2024-01-03 DIAGNOSIS — N200 Calculus of kidney: Secondary | ICD-10-CM

## 2024-01-05 ENCOUNTER — Ambulatory Visit
Admission: RE | Admit: 2024-01-05 | Discharge: 2024-01-05 | Disposition: A | Discharge status: Home / Self Care | Payer: Self-pay | Source: Ambulatory Visit | Attending: Internal Medicine | Admitting: Internal Medicine

## 2024-01-05 DIAGNOSIS — N200 Calculus of kidney: Secondary | ICD-10-CM

## 2024-04-18 LAB — OPHTHALMOLOGY REPORT-SCANNED

## 2024-05-06 ENCOUNTER — Telehealth: Payer: Self-pay | Admitting: Neurology

## 2024-05-06 NOTE — Telephone Encounter (Signed)
 Optometry evaluation 04/18/2024 showed resolution of the papilledema.  No diabetic retinopathy.

## 2024-05-15 ENCOUNTER — Encounter: Payer: Self-pay | Admitting: Neurology

## 2024-05-15 ENCOUNTER — Ambulatory Visit: Payer: Commercial Managed Care - HMO | Admitting: Neurology

## 2024-05-15 VITALS — BP 120/78 | HR 86 | Ht 63.5 in | Wt 181.5 lb

## 2024-05-15 DIAGNOSIS — G932 Benign intracranial hypertension: Secondary | ICD-10-CM | POA: Diagnosis not present

## 2024-05-15 DIAGNOSIS — Z6831 Body mass index (BMI) 31.0-31.9, adult: Secondary | ICD-10-CM | POA: Diagnosis not present

## 2024-05-15 DIAGNOSIS — H4710 Unspecified papilledema: Secondary | ICD-10-CM

## 2024-05-15 MED ORDER — ACETAZOLAMIDE 250 MG PO TABS: 250.0000 mg | ORAL_TABLET | Freq: Two times a day (BID) | ORAL | 3 refills | Status: AC

## 2024-05-15 NOTE — Progress Notes (Signed)
 [facility]  PATIENT: Cynthia Noble DOB: [birth year]  REFERRING DOCTOR OR PCP: Dr. Zachary Dame, OD. SOURCE: Patient, notes from optometry, visual field and funduscopy results reviewed  _________________________________   HISTORICAL  CHIEF COMPLAINT:  Chief Complaint  Patient presents with   Follow-up    Rm10, alone, IIH followup     HISTORY OF PRESENT ILLNESS:  Cynthia Noble is a 58 y.o. 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  Update 05/15/2024: She is on acetazolamide  500 mg po bid.   She has noted changes in taste and nausea initially but now no s.e.      Recent optomettry eval 04/18/24 showed resolution of the papilledema.  No diabetic retinopathy.  She presented with HA and papillitis   In late 2024, she has had an MRI of the brain that was noncontributory.  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  She was started on acetazolamide  500 mg twice daily.  She takes bid most days.    She is doing well neurologically.   She denies issues with balance, gait, strength or sensation.    She denies lightheadedness upon standing.   No vertigo, No tinnitus.    Vascular risks:  HTN (controlled), borderline DM, tobacco (1 ppw x 40 years), hyperlipidemia+  She is overweight but has lost weight from 206 to 182 pounds  She is trying to eat better.    Her goal is 160-165.   IIH/papilledema She was noted to have bilateral papillitis on a routine eye exam in 2024.  She had not noted visual symptoms or headache.  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  Corrected VA was 20/30 OD and 20/20 OS.     Colo vision is symmetric.   She notes no asymmetry.      She denies VF changes.     MRI of the brain in November 2024 was unremarkable.   Lumbar puncture showed elevated intracranial pressure of 35 cm    In the past she had  a PSG nd snored but did not have OSA by her report.     No FH of visual issues.       REVIEW OF SYSTEMS: Constitutional: No fevers, chills, sweats, or change in appetite Eyes: No visual changes, double vision, eye pain Ear, nose and throat: No hearing loss, ear pain, nasal congestion, sore throat Cardiovascular: No chest pain, palpitations Respiratory:  No shortness of breath at rest or with exertion.   No wheezes GastrointestinaI: No nausea, vomiting, diarrhea, abdominal pain, fecal incontinence Genitourinary:  No dysuria, urinary retention or frequency.  No nocturia. Musculoskeletal:  No neck pain, back pain Integumentary: No rash, pruritus, skin lesions Neurological: as above Psychiatric: No depression at this time.  No anxiety Endocrine: No palpitations, diaphoresis, change in appetite, change in weigh or increased thirst Hematologic/Lymphatic:  No anemia, purpura, petechiae. Allergic/Immunologic: No itchy/runny eyes, nasal congestion, recent allergic reactions, rashes  ALLERGIES: Allergies  Allergen Reactions   Empagliflozin Itching    Other Reaction(s): itching, Not available  Jardiance   Sulfamethoxazole-Trimethoprim Itching and Rash    Itching and hives.  Other Reaction(s): Not available  sulfamethoxazole / trimethoprim    HOME MEDICATIONS:  Current Outpatient Medications:    atorvastatin (LIPITOR) 10 MG tablet, Take 10 mg by mouth daily., Disp: , Rfl:  cephALEXin  (KEFLEX ) 500 MG capsule, Take 1 capsule (500 mg total) by mouth 2 (two) times daily., Disp: 6 capsule, Rfl: 0   lisinopril (ZESTRIL) 5 MG tablet, Take 5 mg by mouth daily., Disp: , Rfl:    metFORMIN (GLUCOPHAGE-XR) 500 MG 24 hr tablet, Take 500 mg by mouth daily., Disp: , Rfl:   PAST MEDICAL HISTORY: Past Medical History:  Diagnosis Date   Diabetes (HCC)    Hypertension     PAST SURGICAL HISTORY: Past  Surgical History:  Procedure Laterality Date   BREAST BIOPSY Right    benign    FAMILY HISTORY: Family History  Problem Relation Age of Onset   High blood pressure Mother    Cervical cancer Sister     SOCIAL HISTORY: Social History   Socioeconomic History   Marital status: Married    Spouse name: Not on file   Number of children: 0   Years of education: Not on file   Highest education level: Associate degree: academic program  Occupational History   Not on file  Tobacco Use   Smoking status: Every Day    Current packs/day: 0.25    Types: Cigarettes   Smokeless tobacco: Never  Vaping Use   Vaping status: Never Used  Substance and Sexual Activity   Alcohol use: Yes    Comment: social once per month   Drug use: Never   Sexual activity: Not on file  Other Topics Concern   Not on file  Social History Narrative   Left handed    Wear glasses    Drinks coffee (rare )   1 can of soda per day.   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   General: The patient is well-developed and well-nourished and in no acute distress  HEENT:  Head is [place]/AT.  Sclera are anicteric.  Funduscopic exam shows only mild optic nerve edema now and VP are present. --- improved appearance   Skin: Extremities are without rash or  edema.  Musculoskeletal:  Back is nontender  Neurologic Exam  Mental status: The patient is alert and oriented x 3 at the time of the examination. The patient has apparent normal recent and remote memory, with an apparently normal  attention span and concentration ability.   Speech is normal.  Cranial nerves: Extraocular movements are full. Pupils are equal, round, and reactive to light and accomodation.  Visual fields are full.  Facial symmetry is present. There is good facial sensation to soft touch bilaterally.Facial strength is normal.  Trapezius and sternocleidomastoid strength is normal. No dysarthria is noted.  The tongue is midline, and the patient has symmetric elevation of the soft palate. No obvious hearing deficits are noted.  Motor:  Muscle bulk is normal.   Tone is normal. Strength is  5 / 5 in all 4 extremities.   Sensory: Sensory testing is intact to pinprick, soft touch and vibration sensation in all 4  extremities.  Coordination: Cerebellar testing reveals good finger-nose-finger and heel-to-shin bilaterally.  Gait and station: Station is normal.   Gait is normal. Tandem gait is normal. Romberg is negative.   Reflexes: Deep tendon reflexes are symmetric and normal bilaterally.        ASSESSMENT AND PLAN  IIH (idiopathic intracranial hypertension)  Papilledema  BMI 31.0-31.9,adult   Continue acetazolamide  but lowere to 250 mg po bid  --- if able to lose further weight, consider d/c and following her eye exams.    Current exam is back to baseline.  She will f/u with ophtho annually Stay active and exercise as tolerated. She will return to see me in 1 year.      Cynthia Noble A. Vear, MD, [facility] 05/15/2024, 1:24 PM Certified in Neurology, Clinical Neurophysiology, Sleep Medicine and Neuroimaging  [facility] [address], [address] [place], [place] [address] [phone]

## 2025-05-23 ENCOUNTER — Ambulatory Visit: Admitting: Neurology
